# Patient Record
Sex: Female | Born: 1990 | Race: Black or African American | Hispanic: No | Marital: Single
Health system: Southern US, Community
[De-identification: ages and names within clinical notes are randomized; demographics above are authoritative.]

---

## 2017-01-07 ENCOUNTER — Encounter (HOSPITAL_COMMUNITY): Payer: Self-pay | Admitting: Emergency Medicine

## 2017-01-07 ENCOUNTER — Emergency Department (HOSPITAL_COMMUNITY): Payer: Medicaid Other

## 2017-01-07 ENCOUNTER — Emergency Department (HOSPITAL_COMMUNITY)
Admission: EM | Admit: 2017-01-07 | Discharge: 2017-01-07 | Disposition: A | Discharge status: Home / Self Care | Payer: Medicaid Other | Attending: Emergency Medicine | Admitting: Emergency Medicine

## 2017-01-07 DIAGNOSIS — Z23 Encounter for immunization: Secondary | ICD-10-CM | POA: Diagnosis not present

## 2017-01-07 DIAGNOSIS — S01412A Laceration without foreign body of left cheek and temporomandibular area, initial encounter: Secondary | ICD-10-CM | POA: Diagnosis not present

## 2017-01-07 DIAGNOSIS — Y929 Unspecified place or not applicable: Secondary | ICD-10-CM | POA: Insufficient documentation

## 2017-01-07 DIAGNOSIS — Y939 Activity, unspecified: Secondary | ICD-10-CM | POA: Diagnosis not present

## 2017-01-07 DIAGNOSIS — Y999 Unspecified external cause status: Secondary | ICD-10-CM | POA: Diagnosis not present

## 2017-01-07 DIAGNOSIS — S0993XA Unspecified injury of face, initial encounter: Secondary | ICD-10-CM

## 2017-01-07 DIAGNOSIS — S01512A Laceration without foreign body of oral cavity, initial encounter: Secondary | ICD-10-CM | POA: Insufficient documentation

## 2017-01-07 DIAGNOSIS — S0181XA Laceration without foreign body of other part of head, initial encounter: Secondary | ICD-10-CM

## 2017-01-07 MED ORDER — TETANUS-DIPHTH-ACELL PERTUSSIS 5-2.5-18.5 LF-MCG/0.5 IM SUSP
0.5000 mL | Freq: Once | INTRAMUSCULAR | Status: AC
  Administered 2017-01-07: 0.5 mL via INTRAMUSCULAR
  Filled 2017-01-07: qty 0.5

## 2017-01-07 MED ORDER — BUPIVACAINE-EPINEPHRINE (PF) 0.5% -1:200000 IJ SOLN
10.0000 mL | Freq: Once | INTRAMUSCULAR | Status: AC
  Administered 2017-01-07: 1 mL
  Filled 2017-01-07: qty 10

## 2017-01-07 MED ORDER — AMOXICILLIN 500 MG PO CAPS
500.0000 mg | ORAL_CAPSULE | Freq: Three times a day (TID) | ORAL | 0 refills | Status: AC
Start: 2017-01-07 — End: ?

## 2017-01-07 MED ORDER — IBUPROFEN 400 MG PO TABS
800.0000 mg | ORAL_TABLET | Freq: Once | ORAL | Status: AC
  Administered 2017-01-07: 800 mg via ORAL
  Filled 2017-01-07: qty 2

## 2017-01-07 MED ORDER — HYDROCODONE-ACETAMINOPHEN 5-325 MG PO TABS: 1.0000 | ORAL_TABLET | Freq: Four times a day (QID) | ORAL | 0 refills | Status: AC | PRN

## 2017-01-07 MED ORDER — IBUPROFEN 600 MG PO TABS: 600.0000 mg | ORAL_TABLET | Freq: Four times a day (QID) | ORAL | 0 refills | Status: AC | PRN

## 2017-01-07 MED ORDER — AMOXICILLIN 500 MG PO CAPS
500.0000 mg | ORAL_CAPSULE | Freq: Once | ORAL | Status: AC
  Administered 2017-01-07: 500 mg via ORAL
  Filled 2017-01-07: qty 1

## 2017-01-07 NOTE — ED Notes (Signed)
Pt called for room with no answer. 

## 2017-01-07 NOTE — ED Notes (Signed)
PA at bedside.

## 2017-01-07 NOTE — ED Notes (Signed)
Patient transported to X-ray 
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
## 2017-01-07 NOTE — Discharge Instructions (Signed)
Take Amoxicillin as prescribed until finished. Schedule follow up with a dentist as soon as you are able. Avoid biting into hard foods; cut them into small pieces and chew them with your right sided molars. Follow up with a dentist as soon as you are able for further management of your loose teeth. Return to the ED if symptoms worsen or if your lacerations (cuts) develop signs of infection. Your stitches to not need to be removed; they will dissolve and fall out when they are ready.
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
